# Patient Record
Sex: Male | Born: 1990 | Race: White | Hispanic: No | Marital: Single | Smoking: Never smoker
Health system: Southern US, Community
[De-identification: ages and names within clinical notes are randomized; demographics above are authoritative.]

---

## 2015-07-08 ENCOUNTER — Emergency Department (HOSPITAL_BASED_OUTPATIENT_CLINIC_OR_DEPARTMENT_OTHER): Payer: Worker's Compensation

## 2015-07-08 ENCOUNTER — Emergency Department (HOSPITAL_BASED_OUTPATIENT_CLINIC_OR_DEPARTMENT_OTHER)
Admission: EM | Admit: 2015-07-08 | Discharge: 2015-07-08 | Disposition: A | Discharge status: Home / Self Care | Payer: Worker's Compensation | Attending: Emergency Medicine | Admitting: Emergency Medicine

## 2015-07-08 ENCOUNTER — Encounter (HOSPITAL_BASED_OUTPATIENT_CLINIC_OR_DEPARTMENT_OTHER): Payer: Self-pay | Admitting: *Deleted

## 2015-07-08 DIAGNOSIS — S6991XA Unspecified injury of right wrist, hand and finger(s), initial encounter: Secondary | ICD-10-CM | POA: Diagnosis present

## 2015-07-08 DIAGNOSIS — S60221A Contusion of right hand, initial encounter: Secondary | ICD-10-CM | POA: Diagnosis not present

## 2015-07-08 DIAGNOSIS — Y9389 Activity, other specified: Secondary | ICD-10-CM | POA: Insufficient documentation

## 2015-07-08 DIAGNOSIS — Y9289 Other specified places as the place of occurrence of the external cause: Secondary | ICD-10-CM | POA: Insufficient documentation

## 2015-07-08 DIAGNOSIS — W208XXA Other cause of strike by thrown, projected or falling object, initial encounter: Secondary | ICD-10-CM | POA: Insufficient documentation

## 2015-07-08 DIAGNOSIS — Y990 Civilian activity done for income or pay: Secondary | ICD-10-CM | POA: Insufficient documentation

## 2015-07-08 NOTE — ED Notes (Signed)
Right hand injury. He dropped a drive shaft on his hand at work. Workman's Comp.

## 2015-07-08 NOTE — ED Provider Notes (Signed)
CSN: [id number]     Arrival date & time 07/08/15  1959 History  By signing my name below, I, Linus Galas, attest that this documentation has been prepared under the direction and in the presence of Geoffery Lyons, MD. Electronically Signed: Linus Galas, ED Scribe. 07/08/2015. 9:59 PM.  Chief Complaint  Patient presents with  . Hand Injury   The history is provided by the patient. No language interpreter was used.   HPI Comments: Jordan Pitts is a 25 y.o. male with no PMHx who presents to the Emergency Department complaining of sudden right hand pain s/p injury prior to arrival. 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 Pt denies any other sx at this time.   History reviewed. No pertinent past medical history. History reviewed. No pertinent past surgical history. No family history on file. Social History  Substance Use Topics  . Smoking status: Never Smoker   . Smokeless tobacco: None  . Alcohol Use: No    Review of Systems  Musculoskeletal:       + right hand pain  Skin: Positive for color change.  All other systems reviewed and are negative.  Allergies  Pepto-bismol  Home Medications   Prior to Admission medications   Not on File   BP 140/61 mmHg  Pulse 60  Temp(Src) 97.6 F (36.4 C) (Oral)  Resp 20  Ht  (1.956 m)  Wt 200 lb (90.719 kg)  BMI 23.71 kg/m2  SpO2 100%   Physical Exam  Constitutional: He is oriented to person, place, and time. He appears well-developed and well-nourished.  HENT:  Head: Normocephalic and atraumatic.  Cardiovascular: Normal rate.   Pulmonary/Chest: Effort normal.  Abdominal: He exhibits no distension.  Musculoskeletal:  Mild swelling and TTP of the 3,4, 5 metacarpal. Good ROM of fingers. Right hand contusion   Neurological: He is alert and oriented to person, place, and time.  Skin: Skin is warm and dry.  Psychiatric: He has a normal mood and affect.  Nursing note and vitals  reviewed.   ED Course  Procedures   DIAGNOSTIC STUDIES: Oxygen Saturation is 100% on room air, normal by my interpretation.    COORDINATION OF CARE: 9:56 PM Discussed treatment plan with pt at bedside and pt agreed to plan.  Imaging Review Dg Hand Complete Right  07/08/2015  CLINICAL DATA:  Acute trauma, injury and pain EXAM: RIGHT HAND - COMPLETE 3+ VIEW COMPARISON:  None. FINDINGS: There is no evidence of fracture or dislocation. There is no evidence of arthropathy or other focal bone abnormality. Soft tissues are unremarkable. IMPRESSION: Negative. Electronically Signed   By: Judie Petit.  Shick M.D.   On: 07/08/2015 21:09   I have personally reviewed and evaluated these images and lab results as part of my medical decision-making.   MDM   Final diagnoses:  None    X-rays are negative for fracture. We'll treat as a contusion with anti-inflammatories, rest, ice, and when necessary follow-up.  I personally performed the services described in this documentation, which was scribed in my presence. The recorded information has been reviewed and is accurate.        Geoffery Lyons, MD 07/08/15 2322

## 2015-07-08 NOTE — Discharge Instructions (Signed)
°  Ice for 20 minutes every 2 hours while awake for the next 2 days.  Ibuprofen 600 mg every 6 hours as needed for pain.  Follow-up with your primary Dr. if not improving in the next week.   Hand Contusion A hand contusion is a deep bruise on your hand area. Contusions are the result of an injury that caused bleeding under the skin. The contusion may turn blue, purple, or yellow. Minor injuries will give you a painless contusion, but more severe contusions may stay painful and swollen for a few weeks. CAUSES  A contusion is usually caused by a blow, trauma, or direct force to an area of the body. SYMPTOMS   Swelling and redness of the injured area.  Discoloration of the injured area.  Tenderness and soreness of the injured area.  Pain. DIAGNOSIS  The diagnosis can be made by taking a history and performing a physical exam. An X-ray, CT scan, or MRI may be needed to determine if there were any associated injuries, such as broken bones (fractures). TREATMENT  Often, the best treatment for a hand contusion is resting, elevating, icing, and applying cold compresses to the injured area. Over-the-counter medicines may also be recommended for pain control. HOME CARE INSTRUCTIONS   Put ice on the injured area.  Put ice in a plastic bag.  Place a towel between your skin and the bag.  Leave the ice on for 15-20 minutes, 03-04 times a day.  Only take over-the-counter or prescription medicines as directed by your caregiver. Your caregiver may recommend avoiding anti-inflammatory medicines (aspirin, ibuprofen, and naproxen) for 48 hours because these medicines may increase bruising.  If told, use an elastic wrap as directed. This can help reduce swelling. You may remove the wrap for sleeping, showering, and bathing. If your fingers become numb, cold, or blue, take the wrap off and reapply it more loosely.  Elevate your hand with pillows to reduce swelling.  Avoid overusing your hand if it  is painful. SEEK IMMEDIATE MEDICAL CARE IF:   You have increased redness, swelling, or pain in your hand.  Your swelling or pain is not relieved with medicines.  You have loss of feeling in your hand or are unable to move your fingers.  Your hand turns cold or blue.  You have pain when you move your fingers.  Your hand becomes warm to the touch.  Your contusion does not improve in 2 days. MAKE SURE YOU:   Understand these instructions.  Will watch your condition.  Will get help right away if you are not doing well or get worse.   This information is not intended to replace advice given to you by your health care provider. Make sure you discuss any questions you have with your health care provider.   Document Released: 10/31/2001 Document Revised: 02/03/2012 Document Reviewed: 11/02/2011 Elsevier Interactive Patient Education [employer]

## 2017-08-04 IMAGING — DX DG HAND COMPLETE 3+V*R*
3 series · 3 of 3 positions shown · non-contrast
Comparison: None.

CLINICAL DATA: Acute trauma, injury and pain

EXAM:
RIGHT HAND - COMPLETE 3+ VIEW

[hand pa]
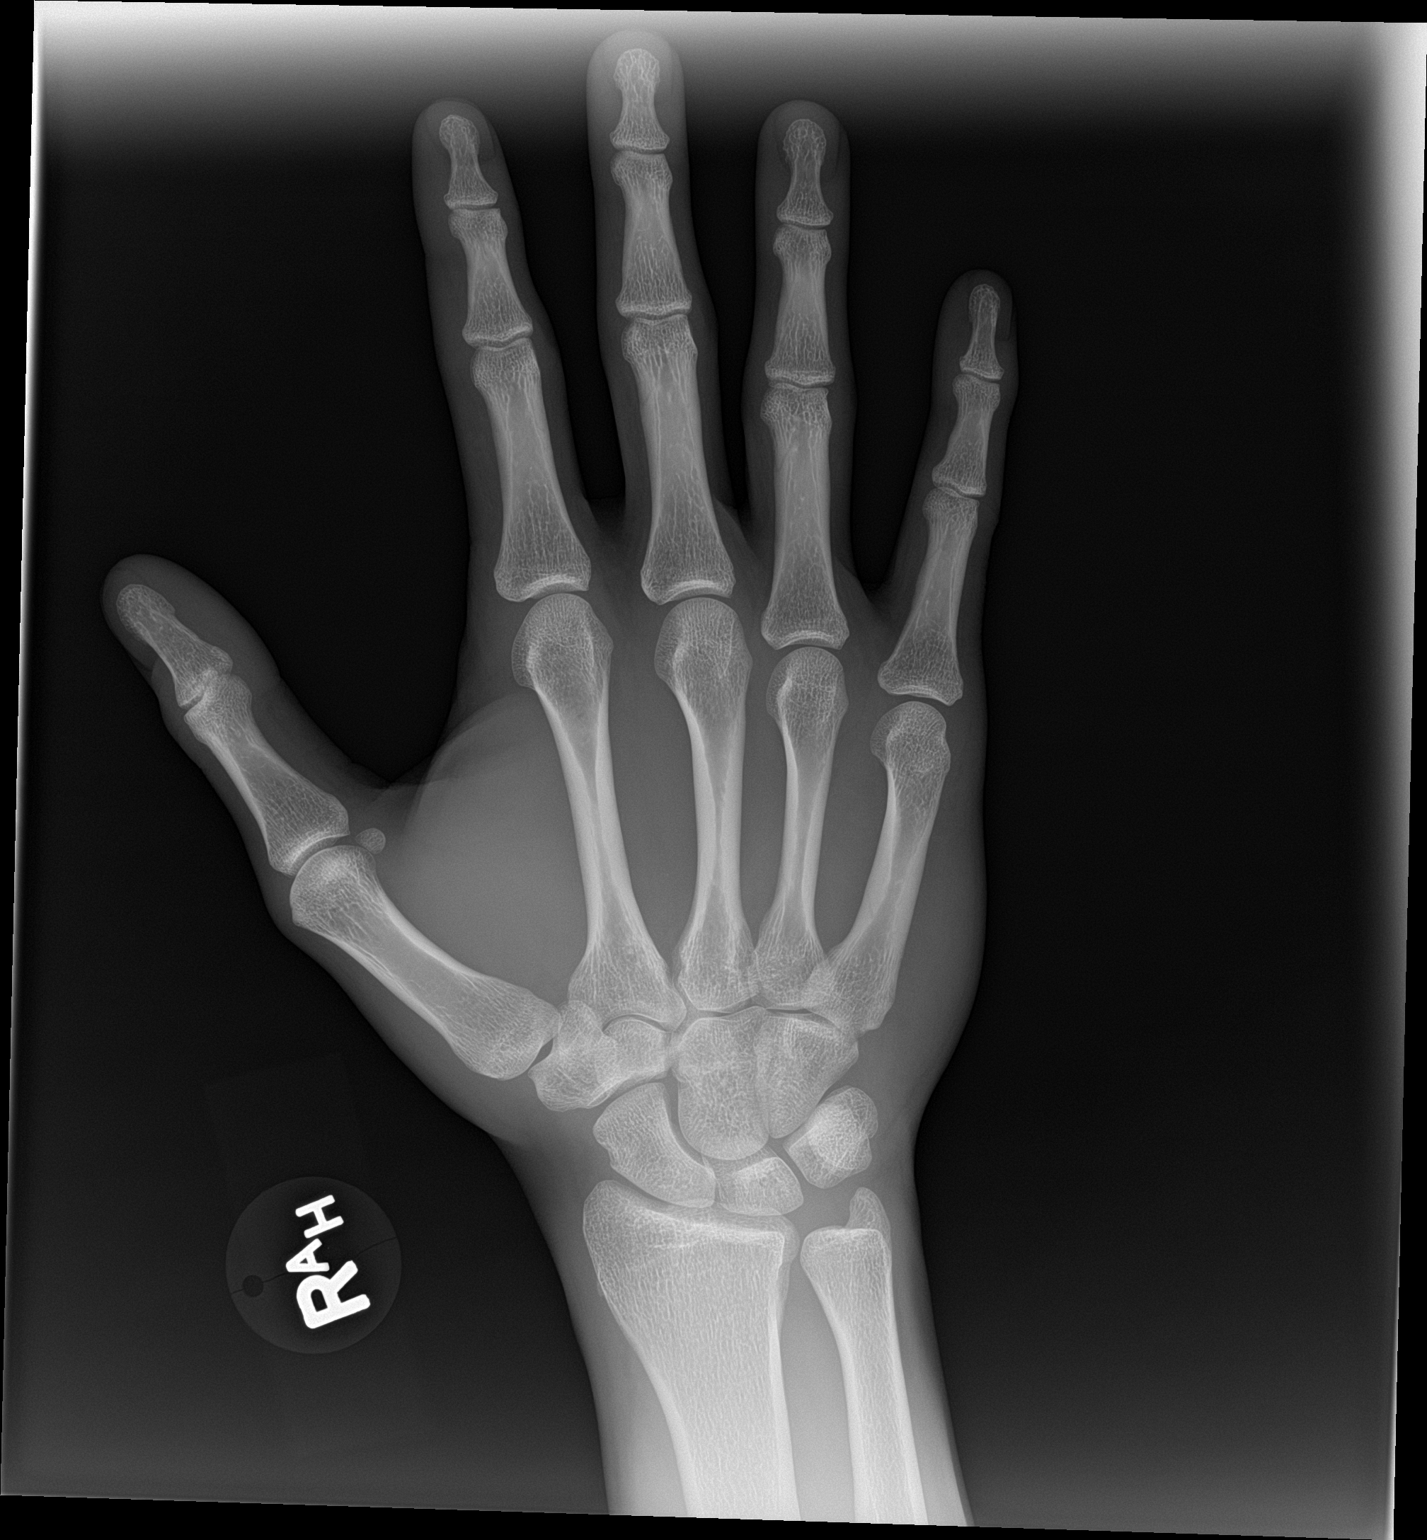

[hand obl]
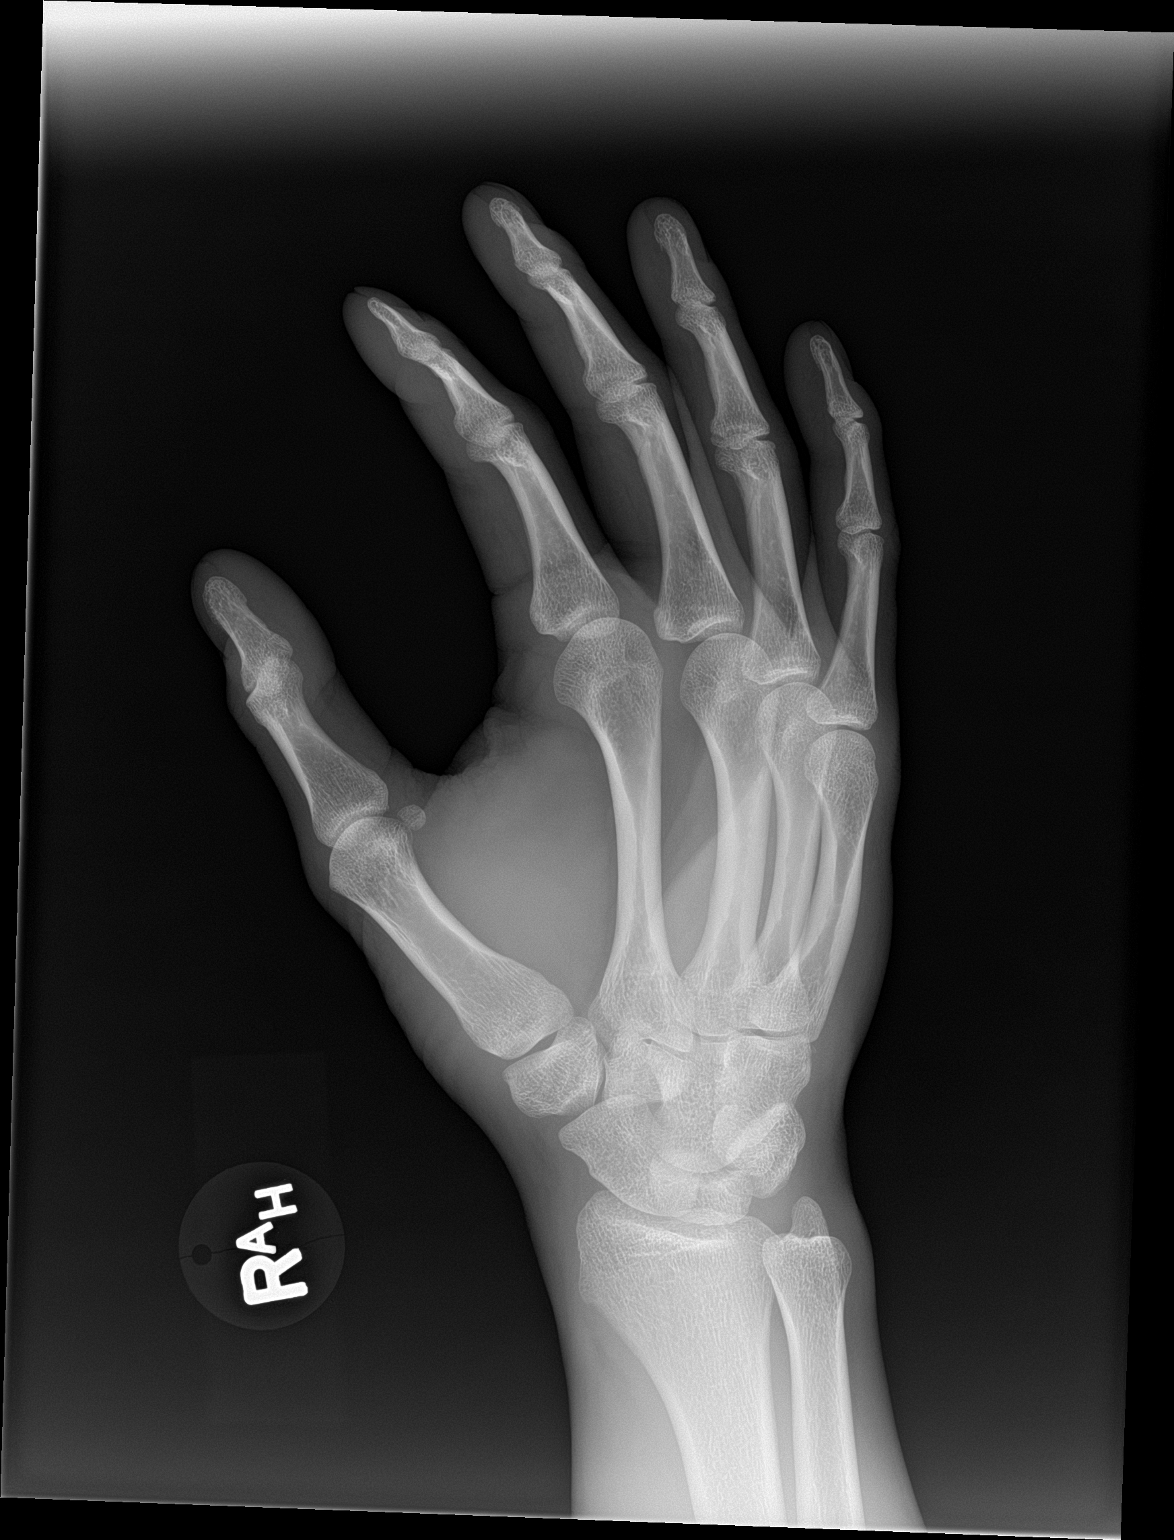

[hand lat]
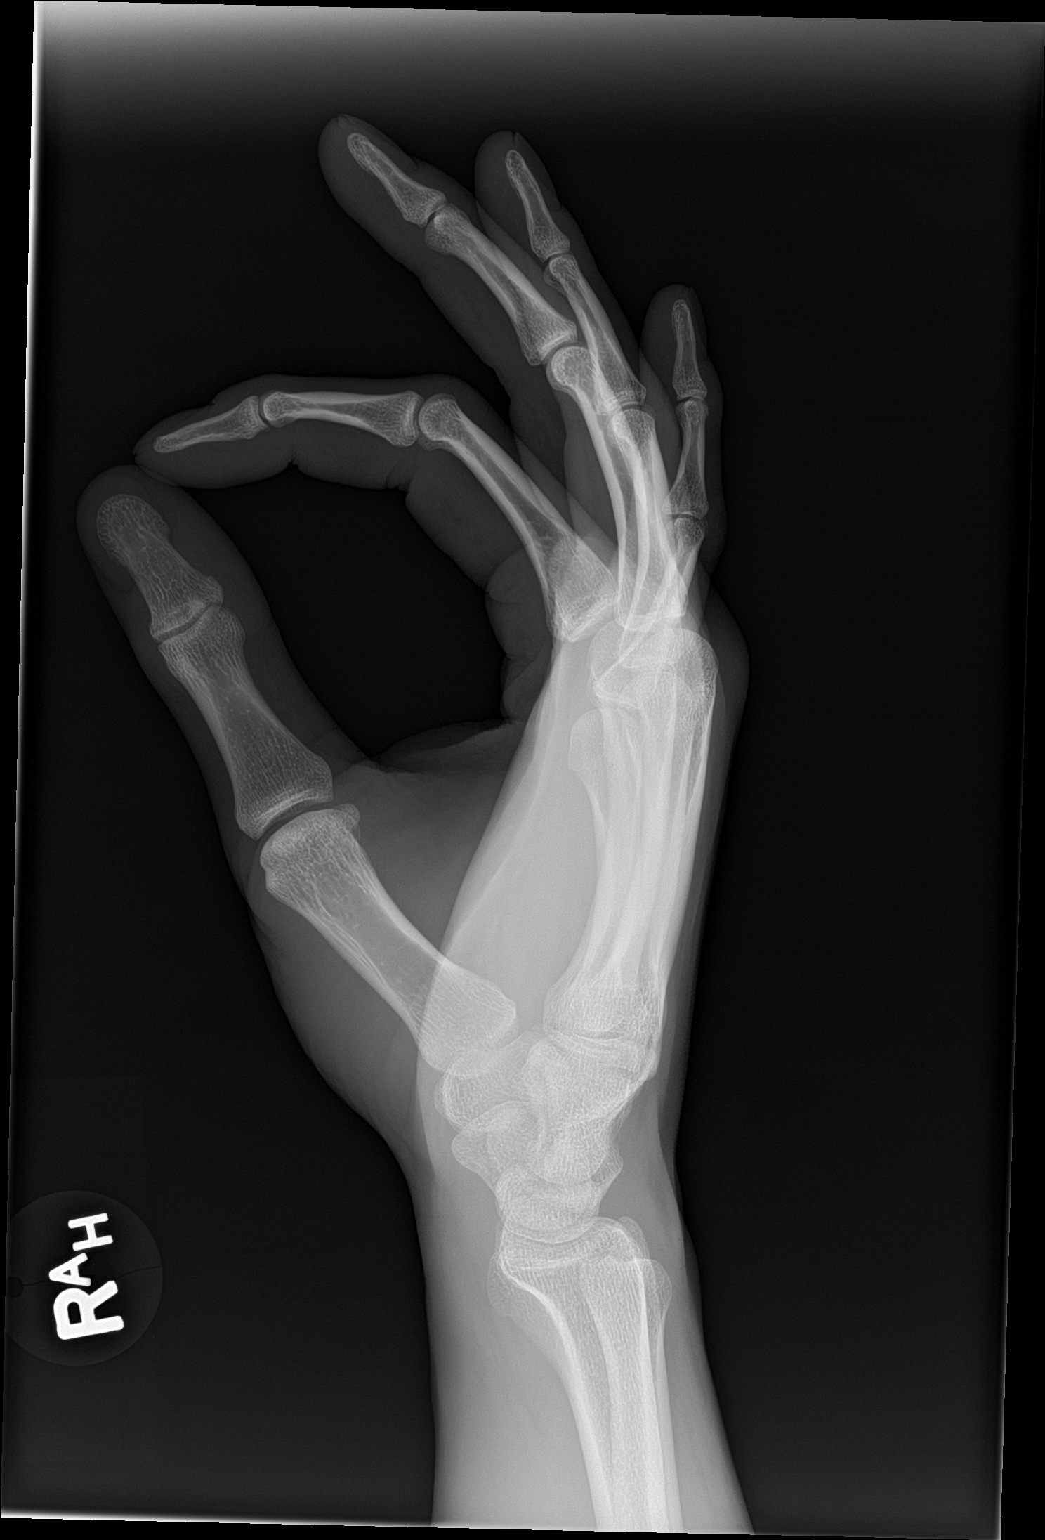

[3 of 3 positions shown; findings below may reference images not displayed]

FINDINGS: There is no evidence of fracture or dislocation. There is no
evidence of arthropathy or other focal bone abnormality. Soft
tissues are unremarkable.
IMPRESSION: Negative.
